# Patient Record
Sex: Female | Born: 2000 | Race: Black or African American | Hispanic: No | Marital: Single | Smoking: Never smoker
Health system: Southern US, Community
[De-identification: ages and names within clinical notes are randomized; demographics above are authoritative.]

## PROBLEM LIST (undated history)

## (undated) ENCOUNTER — Inpatient Hospital Stay (HOSPITAL_COMMUNITY): Payer: Self-pay

## (undated) DIAGNOSIS — M419 Scoliosis, unspecified: Secondary | ICD-10-CM

## (undated) DIAGNOSIS — Z8679 Personal history of other diseases of the circulatory system: Secondary | ICD-10-CM

## (undated) HISTORY — PX: BACK SURGERY: SHX140

## (undated) HISTORY — PX: DILATION AND CURETTAGE OF UTERUS: SHX78

---
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
## 2020-11-17 ENCOUNTER — Encounter (HOSPITAL_BASED_OUTPATIENT_CLINIC_OR_DEPARTMENT_OTHER): Payer: Self-pay | Admitting: Emergency Medicine

## 2020-11-17 ENCOUNTER — Other Ambulatory Visit: Payer: Self-pay

## 2020-11-17 ENCOUNTER — Emergency Department (HOSPITAL_BASED_OUTPATIENT_CLINIC_OR_DEPARTMENT_OTHER)
Admission: EM | Admit: 2020-11-17 | Discharge: 2020-11-17 | Disposition: A | Discharge status: Home / Self Care | Attending: Emergency Medicine | Admitting: Emergency Medicine

## 2020-11-17 DIAGNOSIS — R059 Cough, unspecified: Secondary | ICD-10-CM | POA: Diagnosis present

## 2020-11-17 DIAGNOSIS — U071 COVID-19: Secondary | ICD-10-CM | POA: Diagnosis not present

## 2020-11-17 DIAGNOSIS — Z20822 Contact with and (suspected) exposure to covid-19: Secondary | ICD-10-CM

## 2020-11-17 LAB — GROUP A STREP BY PCR: Group A Strep by PCR: NOT DETECTED

## 2020-11-17 MED ORDER — ACETAMINOPHEN 325 MG PO TABS
650.0000 mg | ORAL_TABLET | Freq: Once | ORAL | Status: AC | PRN
  Administered 2020-11-17: 650 mg via ORAL
  Filled 2020-11-17: qty 2

## 2020-11-17 MED ORDER — BENZONATATE 100 MG PO CAPS: 100.0000 mg | ORAL_CAPSULE | Freq: Three times a day (TID) | ORAL | 0 refills | Status: DC

## 2020-11-17 MED ORDER — IBUPROFEN 600 MG PO TABS: 600.0000 mg | ORAL_TABLET | Freq: Four times a day (QID) | ORAL | 0 refills | Status: DC | PRN

## 2020-11-17 MED ORDER — ONDANSETRON 4 MG PO TBDP: 4.0000 mg | ORAL_TABLET | Freq: Three times a day (TID) | ORAL | 0 refills | Status: AC | PRN

## 2020-11-17 NOTE — ED Provider Notes (Signed)
[facility] EMERGENCY DEPARTMENT Provider Note   CSN: [id number] Arrival date & time: 11/17/20  1627     History Chief Complaint  Patient presents with  . Sore Throat  . Cough    Debra Castro is a 20 y.o. female with no significant past medical history who presents to the ED due to sore throat, cough, and body aches x4 days.  Patient had a positive Covid exposure on Monday.  Patient is currently unvaccinated.  She admits to a productive cough with green phlegm and 2 episodes of nonbloody, nonbilious emesis.  Denies abdominal pain and diarrhea.  Denies chest pain or shortness of breath. No lower extremity edema. Denies difficulty swallowing.  No treatment prior to arrival.  No aggravating or alleviating factors.   History obtained from patient and past medical records. No interpreter used during encounter.      Past Medical History:  Diagnosis Date  . Scoliosis     There are no problems to display for this patient.   Past Surgical History:  Procedure Laterality Date  . BACK SURGERY       OB History   No obstetric history on file.     No family history on file.  Social History   Tobacco Use  . Smoking status: Never Smoker  . Smokeless tobacco: Never Used  Substance Use Topics  . Alcohol use: No  . Drug use: No    Home Medications Prior to Admission medications   Medication Sig Start Date End Date Taking? Authorizing Provider  benzonatate (TESSALON) 100 MG capsule Take 1 capsule (100 mg total) by mouth every 8 (eight) hours. 11/17/20  Yes Patrisia Faeth C, PA-C  ibuprofen (ADVIL) 600 MG tablet Take 1 tablet (600 mg total) by mouth every 6 (six) hours as needed. 11/17/20  Yes Annella Prowell C, PA-C  ondansetron (ZOFRAN ODT) 4 MG disintegrating tablet Take 1 tablet (4 mg total) by mouth every 8 (eight) hours as needed for nausea or vomiting. 11/17/20  Yes Romond Pipkins, Merla Riches, PA-C  methocarbamol (ROBAXIN) 500 MG tablet Take 1 tablet (500 mg total) by  mouth 2 (two) times daily. 10/12/18   Bethel Born, PA-C    Allergies    Patient has no known allergies.  Review of Systems   Review of Systems  Constitutional: Positive for fever. Negative for chills.  HENT: Positive for sore throat.   Respiratory: Positive for cough. Negative for shortness of breath.   Cardiovascular: Negative for chest pain and leg swelling.  Gastrointestinal: Positive for vomiting. Negative for abdominal pain and diarrhea.  Musculoskeletal: Positive for myalgias.  Neurological: Negative for headaches.    Physical Exam Updated Vital Signs BP 101/75 (BP Location: Right Arm)   Pulse (!) 105   Temp 98.3 F (36.8 C) (Oral)   Resp 16   Ht 5\' 10"  (1.778 m)   Wt 72.6 kg   SpO2 99%   BMI 22.96 kg/m   Physical Exam Vitals and nursing note reviewed.  Constitutional:      General: She is not in acute distress.    Appearance: She is not ill-appearing.  HENT:     Head: Normocephalic.     Mouth/Throat:     Comments: Posterior oropharynx clear and mucous membranes moist, there is mild erythema but no edema or tonsillar exudates, uvula midline, normal phonation, no trismus, tolerating secretions without difficulty. Eyes:     Pupils: Pupils are equal, round, and reactive to light.  Neck:     Comments:  No meningismus. Cardiovascular:     Rate and Rhythm: Normal rate and regular rhythm.     Pulses: Normal pulses.     Heart sounds: Normal heart sounds. No murmur heard. No friction rub. No gallop.   Pulmonary:     Effort: Pulmonary effort is normal.     Breath sounds: Normal breath sounds.     Comments: Respirations equal and unlabored, patient able to speak in full sentences, lungs clear to auscultation bilaterally Abdominal:     General: Abdomen is flat. Bowel sounds are normal. There is no distension.     Palpations: Abdomen is soft.     Tenderness: There is no abdominal tenderness. There is no guarding or rebound.  Musculoskeletal:     Cervical back:  Neck supple.     Comments: No lower extremity edema. Negative homan sign bilaterally.  Skin:    General: Skin is warm and dry.  Neurological:     General: No focal deficit present.     Mental Status: She is alert.  Psychiatric:        Mood and Affect: Mood normal.        Behavior: Behavior normal.     ED Results / Procedures / Treatments   Labs (all labs ordered are listed, but only abnormal results are displayed) Labs Reviewed  GROUP A STREP BY PCR  SARS CORONAVIRUS 2 (TAT 6-24 HRS)    EKG None  Radiology No results found.  Procedures Procedures (including critical care time)  Medications Ordered in ED Medications  acetaminophen (TYLENOL) tablet 650 mg (650 mg Oral Given 11/17/20 1646)    ED Course  I have reviewed the triage vital signs and the nursing notes.  Pertinent labs & imaging results that were available during my care of the patient were reviewed by me and considered in my medical decision making (see chart for details).    MDM Rules/Calculators/A&P                         20 year old female presents to the ED due to Covid-like symptoms x4 days.  Patient denies chest pain and shortness of breath.  Patient is currently unvaccinated and had a positive for Covid exposure.  Upon arrival, is patient febrile and tachycardic likely due to temperature.  Unfortunately, patient waited over 6 hours prior to initial evaluation.  Temperature and heart rate improved. Patient mildly tachycardic at 105.  Patient in no acute distress and non-ill-appearing.  Physical exam reassuring.  Lungs clear to auscultation bilaterally with no rales, rhonchi, or wheeze.  Doubt pneumonia.  No meningismus to suggest meningitis.  Throat with mild erythema with no tonsillar hypertrophy.  No abscess appreciated.  Strep ordered at triage which is negative.  Covid test pending.  Suspect symptoms related to Covid infection vs. Other viral etiology.  Low suspicion for DVT/PE given no chest pain or  shortness of breath.  No clinical signs of DVT on exam.  Will discharge patient with symptomatic treatment.  Quarantine guidelines discussed with patient. Strict ED precautions discussed with patient. Patient states understanding and agrees to plan. Patient discharged home in no acute distress and stable vitals.  Discussed case with Dr. Read Drivers who agrees with assessment and plan.  Debra Castro was evaluated in Emergency Department on 11/17/2020 for the symptoms described in the history of present illness. She was evaluated in the context of the global COVID-19 pandemic, which necessitated consideration that the patient might be at risk for infection  with the SARS-CoV-2 virus that causes COVID-19. Institutional protocols and algorithms that pertain to the evaluation of patients at risk for COVID-19 are in a state of rapid change based on information released by regulatory bodies including the CDC and federal and state organizations. These policies and algorithms were followed during the patient's care in the ED.  Final Clinical Impression(s) / ED Diagnoses Final diagnoses:  Cough  Suspected COVID-19 virus infection    Rx / DC Orders ED Discharge Orders         Ordered    benzonatate (TESSALON) 100 MG capsule  Every 8 hours        11/17/20 2322    ondansetron (ZOFRAN ODT) 4 MG disintegrating tablet  Every 8 hours PRN        11/17/20 2322    ibuprofen (ADVIL) 600 MG tablet  Every 6 hours PRN        11/17/20 2322           Karie Kirks 11/17/20 2324    Davonna Belling, MD 11/17/20 [phone]

## 2020-11-17 NOTE — ED Triage Notes (Signed)
Sore throat, cough, body aches x 2 days

## 2020-11-17 NOTE — Discharge Instructions (Addendum)
As discussed, your Covid test is pending.  I suspect your symptoms are related to Covid infection or another viral infection.  I am sending you with cough medication, nausea medication, and ibuprofen.  Take as needed.  Continue to self quarantine until your results become available.  Please follow-up with PCP if symptoms do not improve over the next week.  Return to the ER for new or worsening symptoms.

## 2020-11-19 LAB — SARS CORONAVIRUS 2 (TAT 6-24 HRS): SARS Coronavirus 2: POSITIVE — AB

## 2021-01-19 ENCOUNTER — Other Ambulatory Visit: Payer: Self-pay

## 2021-01-19 ENCOUNTER — Encounter (HOSPITAL_BASED_OUTPATIENT_CLINIC_OR_DEPARTMENT_OTHER): Payer: Self-pay | Admitting: *Deleted

## 2021-01-19 ENCOUNTER — Emergency Department (HOSPITAL_BASED_OUTPATIENT_CLINIC_OR_DEPARTMENT_OTHER): Payer: 59

## 2021-01-19 ENCOUNTER — Emergency Department (HOSPITAL_BASED_OUTPATIENT_CLINIC_OR_DEPARTMENT_OTHER)
Admission: EM | Admit: 2021-01-19 | Discharge: 2021-01-19 | Disposition: A | Discharge status: Home / Self Care | Payer: 59 | Attending: Emergency Medicine | Admitting: Emergency Medicine

## 2021-01-19 DIAGNOSIS — O26851 Spotting complicating pregnancy, first trimester: Secondary | ICD-10-CM | POA: Insufficient documentation

## 2021-01-19 DIAGNOSIS — Z3A12 12 weeks gestation of pregnancy: Secondary | ICD-10-CM | POA: Diagnosis not present

## 2021-01-19 DIAGNOSIS — N939 Abnormal uterine and vaginal bleeding, unspecified: Secondary | ICD-10-CM

## 2021-01-19 DIAGNOSIS — O4690 Antepartum hemorrhage, unspecified, unspecified trimester: Secondary | ICD-10-CM

## 2021-01-19 DIAGNOSIS — O418X10 Other specified disorders of amniotic fluid and membranes, first trimester, not applicable or unspecified: Secondary | ICD-10-CM

## 2021-01-19 LAB — COMPREHENSIVE METABOLIC PANEL
ALT: 13 U/L (ref 0–44)
AST: 19 U/L (ref 15–41)
Albumin: 4.1 g/dL (ref 3.5–5.0)
Alkaline Phosphatase: 35 U/L — ABNORMAL LOW (ref 38–126)
Anion gap: 11 (ref 5–15)
BUN: 9 mg/dL (ref 6–20)
CO2: 21 mmol/L — ABNORMAL LOW (ref 22–32)
Calcium: 9.5 mg/dL (ref 8.9–10.3)
Chloride: 102 mmol/L (ref 98–111)
Creatinine, Ser: 0.62 mg/dL (ref 0.44–1.00)
GFR, Estimated: >60 mL/min (ref >60)
Glucose, Bld: 84 mg/dL (ref 70–99)
Potassium: 3.1 mmol/L — ABNORMAL LOW (ref 3.5–5.1)
Sodium: 134 mmol/L — ABNORMAL LOW (ref 135–145)
Total Bilirubin: 0.1 mg/dL — ABNORMAL LOW (ref 0.3–1.2)
Total Protein: 7.9 g/dL (ref 6.5–8.1)

## 2021-01-19 LAB — CBC WITH DIFFERENTIAL/PLATELET
Abs Immature Granulocytes: 0.02 10*3/uL (ref 0.00–0.07)
Basophils Absolute: 0.1 10*3/uL (ref 0.0–0.1)
Basophils Relative: 1 %
Eosinophils Absolute: 0.1 10*3/uL (ref 0.0–0.5)
Eosinophils Relative: 1 %
HCT: 34.3 % — ABNORMAL LOW (ref 36.0–46.0)
Hemoglobin: 11 g/dL — ABNORMAL LOW (ref 12.0–15.0)
Immature Granulocytes: 0 %
Lymphocytes Relative: 27 %
Lymphs Abs: 2.2 10*3/uL (ref 0.7–4.0)
MCH: 24.9 pg — ABNORMAL LOW (ref 26.0–34.0)
MCHC: 32.1 g/dL (ref 30.0–36.0)
MCV: 77.6 fL — ABNORMAL LOW (ref 80.0–100.0)
Monocytes Absolute: 0.6 10*3/uL (ref 0.1–1.0)
Monocytes Relative: 7 %
Neutro Abs: 5.1 10*3/uL (ref 1.7–7.7)
Neutrophils Relative %: 64 %
Platelets: 249 10*3/uL (ref 150–400)
RBC: 4.42 MIL/uL (ref 3.87–5.11)
RDW: 13.6 % (ref 11.5–15.5)
WBC: 8 10*3/uL (ref 4.0–10.5)
nRBC: 0 % (ref 0.0–0.2)

## 2021-01-19 LAB — ABO/RH: ABO/RH(D): O POS

## 2021-01-19 NOTE — ED Provider Notes (Addendum)
[facility] EMERGENCY DEPARTMENT Provider Note   CSN: [id number] Arrival date & time: 01/19/21  1614     History Chief Complaint  Patient presents with  . Vaginal Bleeding    12 wk preg    Debra Castro is a 20 y.o. female.  20 year old female G2, P1 with routine prenatal care through [facility] presents with complaint of vaginal bleeding at 12 weeks and 1 day pregnant.  Patient states she was told at 6 weeks she had a subchorionic hemorrhage but has not had any bleeding since that time.  Patient states today at 230 she noticed some light pink bleeding followed by heavier bright red bleeding, did bleed through her pants.  Patient has soaked through one pad while waiting in the ER lobby over the last 2 hours.  Also reports lower abdominal cramping.  Patient called her OB and was advised to come to the ER.        Past Medical History:  Diagnosis Date  . Scoliosis     There are no problems to display for this patient.   Past Surgical History:  Procedure Laterality Date  . BACK SURGERY       OB History    Gravida  1   Para      Term      Preterm      AB      Living        SAB      IAB      Ectopic      Multiple      Live Births              No family history on file.  Social History   Tobacco Use  . Smoking status: Never Smoker  . Smokeless tobacco: Never Used  Substance Use Topics  . Alcohol use: No  . Drug use: No    Home Medications Prior to Admission medications   Medication Sig Start Date End Date Taking? Authorizing Provider  benzonatate (TESSALON) 100 MG capsule Take 1 capsule (100 mg total) by mouth every 8 (eight) hours. 11/17/20   Mannie Stabile, PA-C  ibuprofen (ADVIL) 600 MG tablet Take 1 tablet (600 mg total) by mouth every 6 (six) hours as needed. 11/17/20   Mannie Stabile, PA-C  methocarbamol (ROBAXIN) 500 MG tablet Take 1 tablet (500 mg total) by mouth 2 (two) times daily. 10/12/18    Bethel Born, PA-C  ondansetron (ZOFRAN ODT) 4 MG disintegrating tablet Take 1 tablet (4 mg total) by mouth every 8 (eight) hours as needed for nausea or vomiting. 11/17/20   Mannie Stabile, PA-C    Allergies    Patient has no known allergies.  Review of Systems   Review of Systems  Constitutional: Negative for chills and fever.  Respiratory: Negative for shortness of breath.   Cardiovascular: Negative for chest pain.  Gastrointestinal: Negative for constipation, diarrhea, nausea and vomiting.  Genitourinary: Positive for pelvic pain and vaginal bleeding.  Musculoskeletal: Negative for back pain.  Skin: Negative for rash and wound.  Allergic/Immunologic: Negative for immunocompromised state.  Neurological: Negative for dizziness, weakness and light-headedness.  Hematological: Does not bruise/bleed easily.  All other systems reviewed and are negative.   Physical Exam Updated Vital Signs BP 114/65 (BP Location: Right Arm)   Pulse 83   Temp 98.3 F (36.8 C) (Oral)   Resp 20   Ht 5\' 10"  (1.778 m)   Wt 65.8 kg  LMP 10/26/2020   SpO2 100%   BMI 20.81 kg/m   Physical Exam Vitals and nursing note reviewed. Exam conducted with a chaperone present.  Constitutional:      General: She is not in acute distress.    Appearance: She is well-developed and well-nourished. She is not diaphoretic.  HENT:     Head: Normocephalic and atraumatic.  Cardiovascular:     Rate and Rhythm: Normal rate and regular rhythm.     Pulses: Normal pulses.     Heart sounds: Normal heart sounds.  Pulmonary:     Effort: Pulmonary effort is normal.     Breath sounds: Normal breath sounds.  Abdominal:     Palpations: Abdomen is soft.     Tenderness: There is no abdominal tenderness. There is no right CVA tenderness or left CVA tenderness.  Skin:    General: Skin is warm and dry.     Findings: No erythema or rash.  Neurological:     Mental Status: She is alert and oriented to person, place,  and time.  Psychiatric:        Mood and Affect: Mood and affect normal.        Behavior: Behavior normal.     ED Results / Procedures / Treatments   Labs (all labs ordered are listed, but only abnormal results are displayed) Labs Reviewed  CBC WITH DIFFERENTIAL/PLATELET - Abnormal; Notable for the following components:      Result Value   Hemoglobin 11.0 (*)    HCT 34.3 (*)    MCV 77.6 (*)    MCH 24.9 (*)    All other components within normal limits  COMPREHENSIVE METABOLIC PANEL - Abnormal; Notable for the following components:   Sodium 134 (*)    Potassium 3.1 (*)    CO2 21 (*)    Alkaline Phosphatase 35 (*)    Total Bilirubin 0.1 (*)    All other components within normal limits  ABO/RH    EKG None  Radiology US OB LESS THAN 14 WEEKS WITH OB TRANSVAGINAL  Result Date: 01/19/2021 CLINICAL DATA:  Pregnant patient in first-trimester pregnancy with vaginal bleeding and cramping today. Beta hCG not available. Gestational age [redacted] weeks 1 day based on prior imaging. Subarachnoid hemorrhage on outside ultrasound yesterday. EXAM: OBSTETRIC <14 WK [place] AND TRANSVAGINAL OB US TECHNIQUE: Both transabdominal and transvaginal ultrasound examinations were performed for complete evaluation of the gestation as well as the maternal uterus, adnexal regions, and pelvic cul-de-sac. Transvaginal technique was performed to assess early pregnancy. COMPARISON:  None. FINDINGS: Intrauterine gestational sac: Single Yolk sac:  Not Visualized, likely normal for gestational age. Embryo:  Visualized. Cardiac Activity: Visualized. Heart Rate: 168 bpm CRL:  62 mm   12 w   4 d                  [place] EDC: 07/30/2021 Subchorionic hemorrhage: Moderate, primarily anterior to the gestational sac. Maternal uterus/adnexae: Trace fluid in the cervical canal. Neither ovary is visualized. Trace pelvic free fluid. No adnexal mass. IMPRESSION: 1. Single live intrauterine gestation estimated gestational age [redacted] weeks 4 days based on  crown-rump length for ultrasound [facility] 07/30/2021. Trace fluid in the cervical canal is nonspecific. 2. Moderate subchorionic hemorrhage. Electronically Signed   By: Narda Rutherford M.D.   On: 01/19/2021 19:43    Procedures Procedures   Medications Ordered in ED Medications - No data to display  ED Course  I have reviewed the triage vital signs and the  nursing notes.  Pertinent labs & imaging results that were available during my care of the patient were reviewed by me and considered in my medical decision making (see chart for details).  Clinical Course as of 01/19/21 2114  Sat Jan 19, 2021  5346 20 year old female, [redacted]w[redacted]d pregnant with previous diagnosis of subchorionic hemorrhage presents with complaint of abdominal cramping and vaginal bleeding today. No tenderness on exam.  Ultrasound shows 12-week 4-day gestation with positive cardiac activity, subchorionic hemorrhage. CBC with hemoglobin 11.4, hematocrit 34.1, CMP with mild hypokalemia otherwise no significant changes.  Rh a positive.  Patient was tachycardic on arrival, vitals have been stable since. Discussed results and plan of care with patient, recommend home to rest, follow-up with her OB on Monday.  Discussed seeking care in nearest emergency room if needed, discussed/recommended emergent OB care at patient's hospital of choice for her OB practice which is [facility]. [LM]    Clinical Course User Index [LM] Alden Hipp   MDM Rules/Calculators/A&P                          Final Clinical Impression(s) / ED Diagnoses Final diagnoses:  Vaginal bleeding in pregnancy  Subchorionic hematoma in first trimester, single or unspecified fetus    Rx / DC Orders ED Discharge Orders    None       Jeannie Fend, PA-C 01/19/21 2113    Jeannie Fend, PA-C 01/19/21 2114    Milagros Loll, MD 01/21/21 [phone]

## 2021-01-19 NOTE — Discharge Instructions (Addendum)
Follow up with your OB. Go to [facility] (formally [facility]) in [facility] if possible for care with your OB team if needed. [address], [place]

## 2021-01-19 NOTE — ED Notes (Signed)
Patient transported to Ultrasound 

## 2021-01-19 NOTE — ED Triage Notes (Addendum)
Pt reports she is [redacted] weeks pregnant and today noticed some bleeding. She states has gone through "3 pairs of underwear, a pair of sweatpants and 2 liners" since 2 pm and now is wearing a pad. Reports cramps. States she has been on pelvic rest due to a subchorionic hemorrhage. She had an ultrasound yesterday

## 2021-11-28 ENCOUNTER — Ambulatory Visit: Payer: PRIVATE HEALTH INSURANCE | Attending: Internal Medicine

## 2021-11-28 ENCOUNTER — Ambulatory Visit: Payer: PRIVATE HEALTH INSURANCE

## 2021-11-28 DIAGNOSIS — Z23 Encounter for immunization: Secondary | ICD-10-CM

## 2021-11-28 NOTE — Progress Notes (Signed)
° °  Covid-19 Vaccination Clinic  Name:  Debra Castro    MRN: [id number] DOB: 2001/01/13  11/28/2021  [name] was observed post Covid-19 immunization for 15 minutes without incident. She was provided with Vaccine Information Sheet and instruction to access the V-Safe system.   [name] was instructed to call 911 with any severe reactions post vaccine: Difficulty breathing  Swelling of face and throat  A fast heartbeat  A bad rash all over body  Dizziness and weakness   Immunizations Administered     Name Date Dose VIS Date Route   JANSSEN COVID-19 VACCINE 11/28/2021 12:05 PM 0.5 mL 09/05/2020 Intramuscular   Manufacturer: [employer]   Lot: [id number]   NDC: 03546-568-12

## 2021-11-29 ENCOUNTER — Other Ambulatory Visit (HOSPITAL_BASED_OUTPATIENT_CLINIC_OR_DEPARTMENT_OTHER): Payer: Self-pay

## 2021-11-29 MED ORDER — COVID-19 AD26 VACCINE(JANSSEN) 0.5 ML IM SUSP
INTRAMUSCULAR | 0 refills | Status: DC
  Filled 2021-11-29: qty 0.5, 1d supply, fill #0

## 2022-07-31 IMAGING — US US OB < 14 WEEKS - US OB TV
1 series · 13 of 28 positions shown · non-contrast
Comparison: None.

CLINICAL DATA: Pregnant patient in first-trimester pregnancy with
vaginal bleeding and cramping today. Beta hCG not available.
Gestational age 12 weeks 1 day based on prior imaging. Subarachnoid
hemorrhage on outside ultrasound yesterday.

EXAM:
OBSTETRIC <14 WK US AND TRANSVAGINAL OB US
TECHNIQUE: Both transabdominal and transvaginal ultrasound examinations were
performed for complete evaluation of the gestation as well as the
maternal uterus, adnexal regions, and pelvic cul-de-sac.
Transvaginal technique was performed to assess early pregnancy.

[Series 1: us ob < 14 weeks - us ob tv · 13 of 69 slices shown]
[im 3/69]
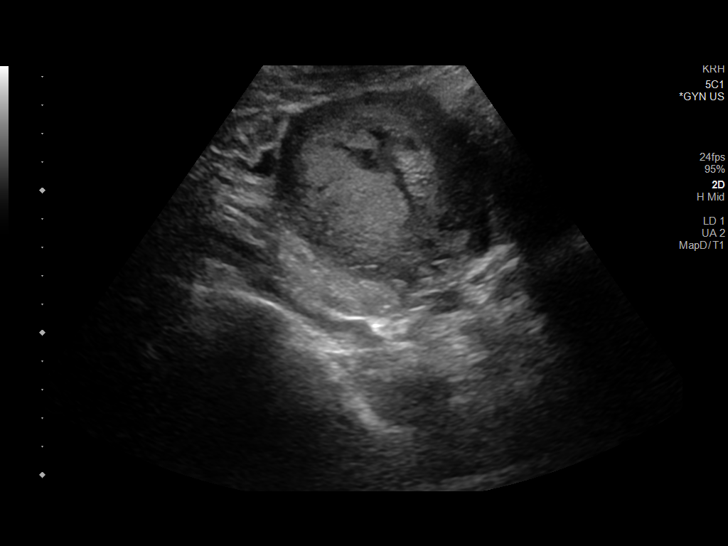
[im 8/69]
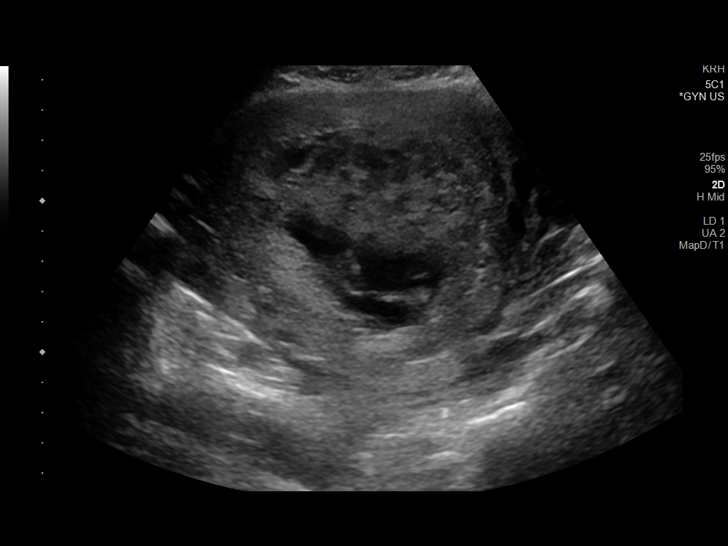
[im 13/69]
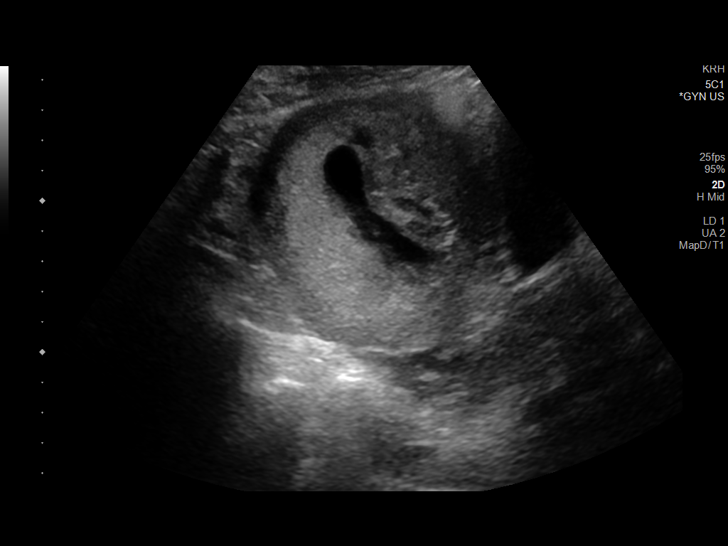
[im 18/69]
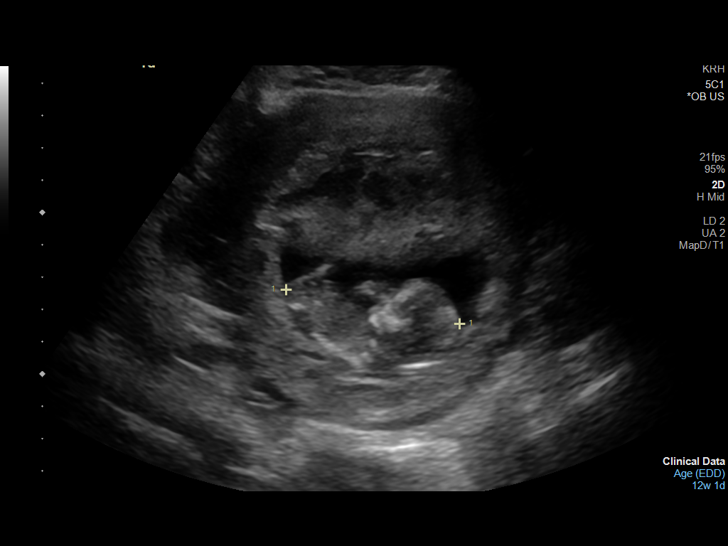
[im 23/69]
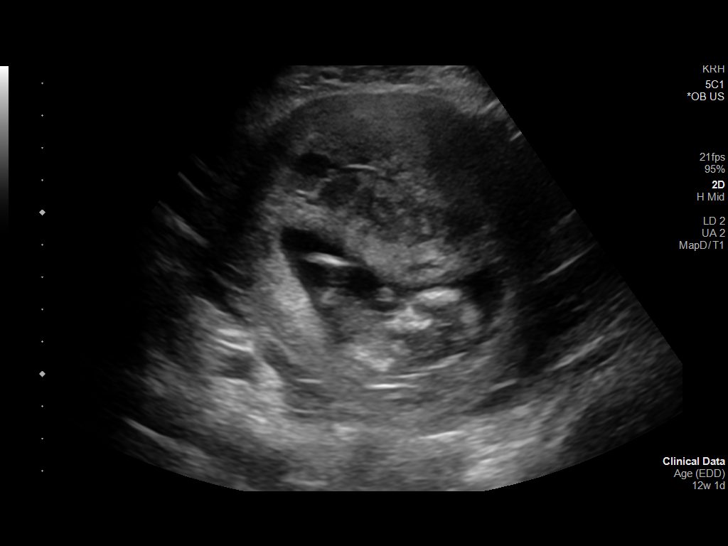
[im 28/69]
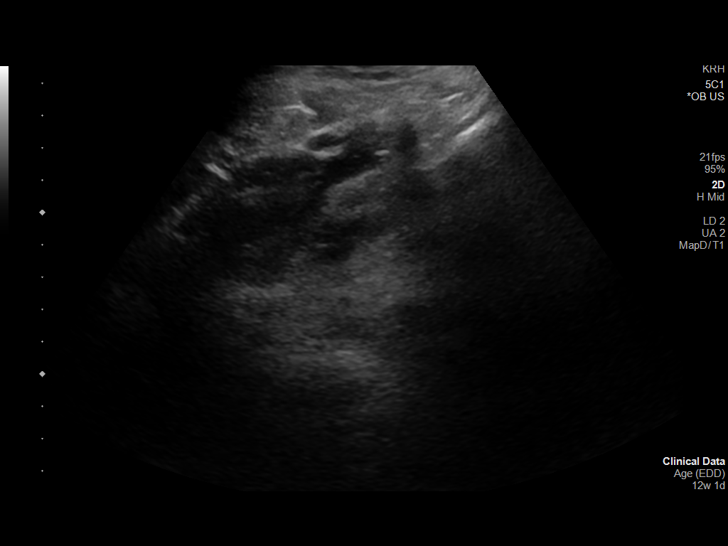
[im 36/69]
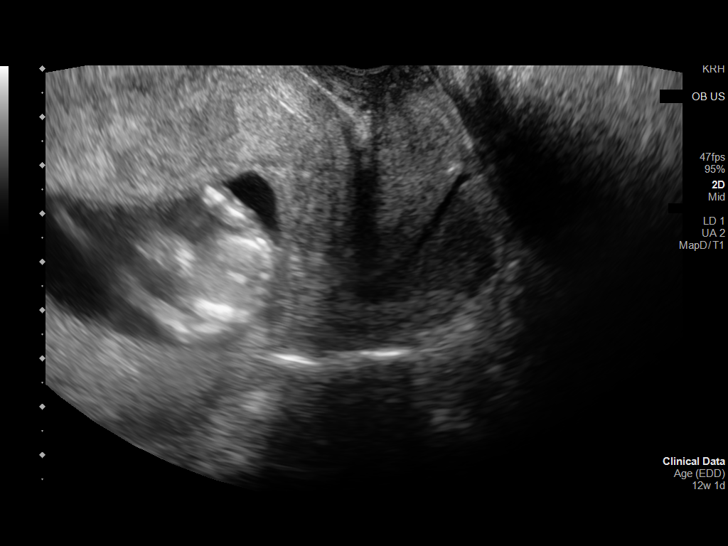
[im 41/69]
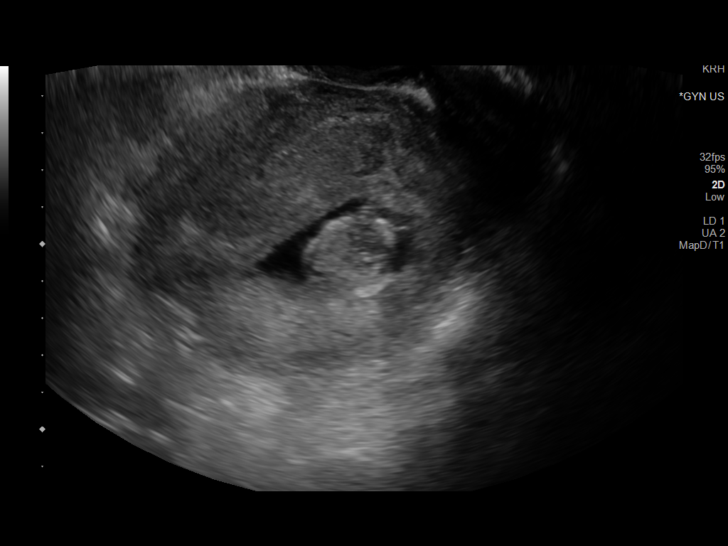
[im 46/69]
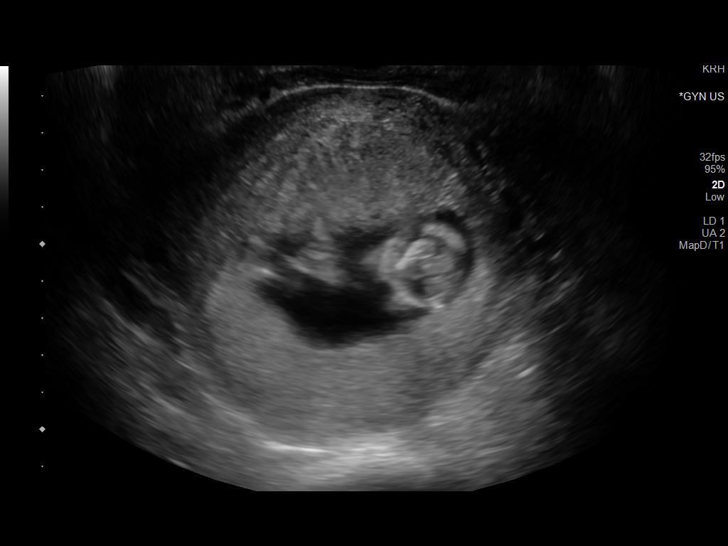
[im 51/69]
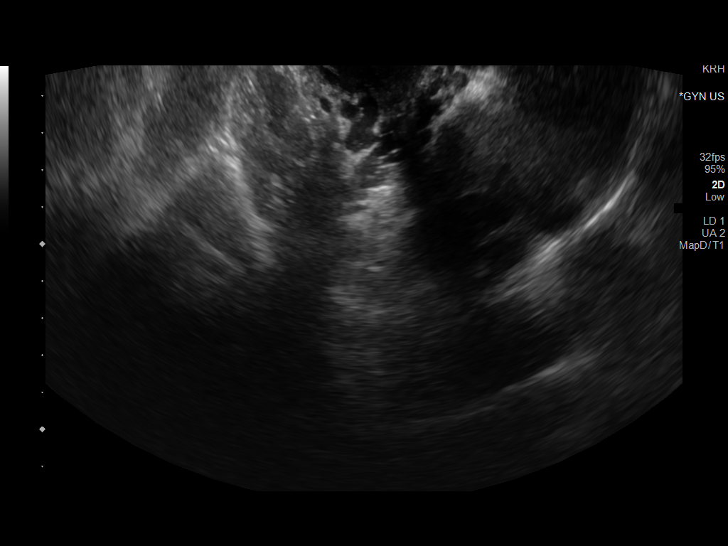
[im 56/69]
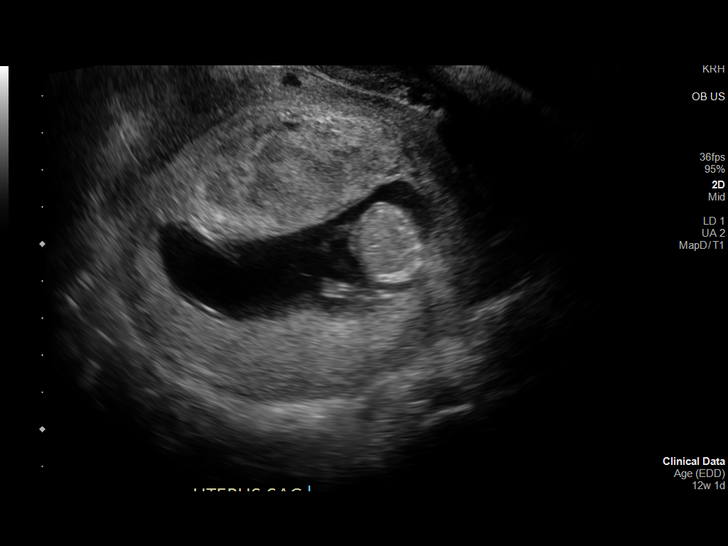
[im 61/69]
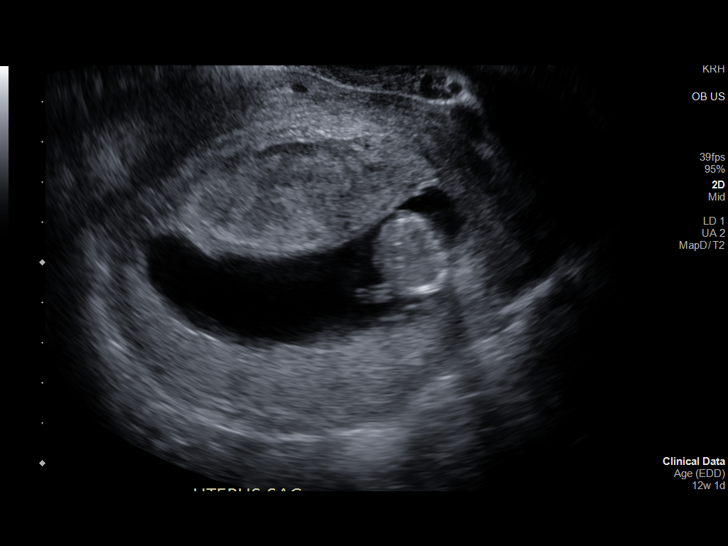
[im 66/69]
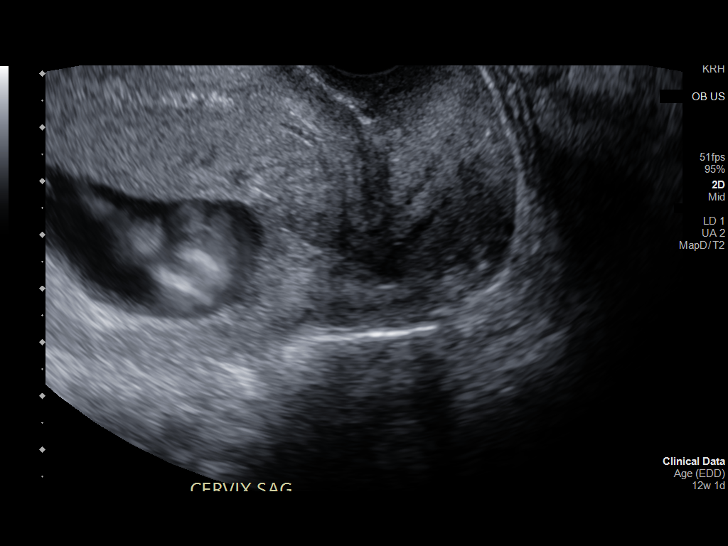

[13 of 28 positions shown; findings below may reference images not displayed]

FINDINGS: Intrauterine gestational sac: Single

Yolk sac:  Not Visualized, likely normal for gestational age.

Embryo:  Visualized.

Cardiac Activity: Visualized.

Heart Rate: 168 bpm

CRL:  62 mm   12 w   4 d                  US EDC: 07/30/2021

Subchorionic hemorrhage: Moderate, primarily anterior to the
gestational sac.

Maternal uterus/adnexae: Trace fluid in the cervical canal. Neither
ovary is visualized. Trace pelvic free fluid. No adnexal mass.
IMPRESSION: 1. Single live intrauterine gestation estimated gestational age 12
weeks 4 days based on crown-rump length for ultrasound EDC
07/30/2021. Trace fluid in the cervical canal is nonspecific.
2. Moderate subchorionic hemorrhage.

## 2022-10-18 ENCOUNTER — Emergency Department (HOSPITAL_BASED_OUTPATIENT_CLINIC_OR_DEPARTMENT_OTHER): Payer: PRIVATE HEALTH INSURANCE

## 2022-10-18 ENCOUNTER — Emergency Department (HOSPITAL_BASED_OUTPATIENT_CLINIC_OR_DEPARTMENT_OTHER)
Admission: EM | Admit: 2022-10-18 | Discharge: 2022-10-19 | Disposition: A | Discharge status: Home / Self Care | Payer: PRIVATE HEALTH INSURANCE | Attending: Emergency Medicine | Admitting: Emergency Medicine

## 2022-10-18 ENCOUNTER — Encounter (HOSPITAL_BASED_OUTPATIENT_CLINIC_OR_DEPARTMENT_OTHER): Payer: Self-pay

## 2022-10-18 ENCOUNTER — Other Ambulatory Visit: Payer: Self-pay

## 2022-10-18 DIAGNOSIS — R0789 Other chest pain: Secondary | ICD-10-CM | POA: Diagnosis not present

## 2022-10-18 DIAGNOSIS — R002 Palpitations: Secondary | ICD-10-CM | POA: Diagnosis present

## 2022-10-18 HISTORY — DX: Personal history of other diseases of the circulatory system: Z86.79

## 2022-10-18 LAB — CBC
HCT: 39.8 % (ref 36.0–46.0)
Hemoglobin: 12.3 g/dL (ref 12.0–15.0)
MCH: 24.2 pg — ABNORMAL LOW (ref 26.0–34.0)
MCHC: 30.9 g/dL (ref 30.0–36.0)
MCV: 78.3 fL — ABNORMAL LOW (ref 80.0–100.0)
Platelets: 237 10*3/uL (ref 150–400)
RBC: 5.08 MIL/uL (ref 3.87–5.11)
RDW: 14.2 % (ref 11.5–15.5)
WBC: 6.9 10*3/uL (ref 4.0–10.5)
nRBC: 0 % (ref 0.0–0.2)

## 2022-10-18 LAB — BASIC METABOLIC PANEL
Anion gap: 9 (ref 5–15)
BUN: 13 mg/dL (ref 6–20)
CO2: 22 mmol/L (ref 22–32)
Calcium: 9.4 mg/dL (ref 8.9–10.3)
Chloride: 106 mmol/L (ref 98–111)
Creatinine, Ser: 0.73 mg/dL (ref 0.44–1.00)
GFR, Estimated: >60 mL/min (ref >60)
Glucose, Bld: 99 mg/dL (ref 70–99)
Potassium: 3.5 mmol/L (ref 3.5–5.1)
Sodium: 137 mmol/L (ref 135–145)

## 2022-10-18 LAB — TROPONIN I (HIGH SENSITIVITY): Troponin I (High Sensitivity): <2 ng/L (ref <18)

## 2022-10-18 NOTE — ED Provider Notes (Signed)
[facility] EMERGENCY DEPARTMENT  Provider Note  CSN: [id number] Arrival date & time: 10/18/22 2011  History Chief Complaint  Patient presents with  . Chest Pain  . Palpitations    Debra Castro is a 21 y.o. female reports several days of palpitations, apple watch has alerted for several episodes of HR in the 150-160s but not sustained. She has felt her heart skipping beats and feeling irregular. She had a ZIO patch placed yesterday by her PCP but no labs had been done. She reports today at work she began to feel lightheaded with headache, L chest and back pains and abdominal pain with nausea. All of those symptoms have since subsided. She recently travelled to [place] by plane. Denies leg swelling.    Home Medications Prior to Admission medications   Medication Sig Start Date End Date Taking? Authorizing Provider  benzonatate (TESSALON) 100 MG capsule Take 1 capsule (100 mg total) by mouth every 8 (eight) hours. 11/17/20   Mannie Stabile, PA-C  COVID-19 Ad26 vaccine, JANSSEN/J&J, 0.5 ML injection Inject into the muscle. 11/28/21   Judyann Munson, MD  ibuprofen (ADVIL) 600 MG tablet Take 1 tablet (600 mg total) by mouth every 6 (six) hours as needed. 11/17/20   Mannie Stabile, PA-C  methocarbamol (ROBAXIN) 500 MG tablet Take 1 tablet (500 mg total) by mouth 2 (two) times daily. 10/12/18   Bethel Born, PA-C  ondansetron (ZOFRAN ODT) 4 MG disintegrating tablet Take 1 tablet (4 mg total) by mouth every 8 (eight) hours as needed for nausea or vomiting. 11/17/20   Mannie Stabile, PA-C     Allergies    Patient has no known allergies.   Review of Systems   Review of Systems Please see HPI for pertinent positives and negatives  Physical Exam BP (!) 151/112   Pulse (!) 106   Temp 99.2 F (37.3 C) (Oral)   Resp 16   Ht 5\' 11"  (1.803 m)   Wt 72.6 kg   LMP 09/29/2022   SpO2 100%   BMI 22.32 kg/m   Physical Exam Vitals and nursing note reviewed.   Constitutional:      Appearance: Normal appearance.  HENT:     Head: Normocephalic and atraumatic.     Nose: Nose normal.     Mouth/Throat:     Mouth: Mucous membranes are moist.  Eyes:     Extraocular Movements: Extraocular movements intact.     Conjunctiva/sclera: Conjunctivae normal.  Cardiovascular:     Rate and Rhythm: Normal rate.  Pulmonary:     Effort: Pulmonary effort is normal.     Breath sounds: Normal breath sounds.  Abdominal:     General: Abdomen is flat.     Palpations: Abdomen is soft.     Tenderness: There is no abdominal tenderness.  Musculoskeletal:        General: No swelling. Normal range of motion.     Cervical back: Neck supple.  Skin:    General: Skin is warm and dry.  Neurological:     General: No focal deficit present.     Mental Status: She is alert.  Psychiatric:        Mood and Affect: Mood normal.     ED Results / Procedures / Treatments   EKG EKG Interpretation  Date/Time:  Saturday October 18 2022 20:22:21 EST Ventricular Rate:  96 PR Interval:  136 QRS Duration: 90 QT Interval:  350 QTC Calculation: 442 R Axis:   78 Text  Interpretation: Normal sinus rhythm Cannot rule out Inferior infarct , age undetermined Nonspecific T wave abnormality Abnormal ECG When compared with ECG of 22-Jan-2018 20:22, No significant change since last tracing Confirmed by Susy Frizzle [phone]) on 10/18/2022 11:35:01 PM  Procedures Procedures  Medications Ordered in the ED Medications - No data to display  Initial Impression and Plan  Patient with rapid heart rate, palpitations and vague self-limited chest/back pains with several other nonspecific symptoms earlier today that have since resolved. She is feeling well at the time of my evaluation. She is currently wearing a ZIO patch. Will check labs including Trops, dimer, TSH, UDS. EKG without acute changes or dysrhythmias.   ED Course   Clinical Course as of 10/18/22 2348  Sat Oct 18, 2022  2348  CBC is normal., BMP and Trop neg. I personally viewed the images from radiology studies and agree with radiologist interpretation: CXR is clear.  [CS]    Clinical Course User Index [CS] Pollyann Savoy, MD     MDM Rules/Calculators/A&P Medical Decision Making Amount and/or Complexity of Data Reviewed Labs: ordered. Radiology: ordered.    Final Clinical Impression(s) / ED Diagnoses Final diagnoses:  None    Rx / DC Orders ED Discharge Orders     None

## 2022-10-18 NOTE — ED Notes (Signed)
Pt not able to give urine at this time. 

## 2022-10-18 NOTE — ED Triage Notes (Addendum)
Pt reports irregular HR with CP and back pain that began at 3pm. Pt reports nausea. Did not take pain meds. Pt denies fever and being around anybody who is sick. Pt reports hx of irregular HR; wearing a holter monitor at this time. Endorses SOB

## 2022-10-19 ENCOUNTER — Encounter (HOSPITAL_BASED_OUTPATIENT_CLINIC_OR_DEPARTMENT_OTHER): Payer: Self-pay

## 2022-10-19 ENCOUNTER — Emergency Department (HOSPITAL_BASED_OUTPATIENT_CLINIC_OR_DEPARTMENT_OTHER): Payer: PRIVATE HEALTH INSURANCE

## 2022-10-19 DIAGNOSIS — R002 Palpitations: Secondary | ICD-10-CM | POA: Diagnosis not present

## 2022-10-19 LAB — URINALYSIS, ROUTINE W REFLEX MICROSCOPIC
Bilirubin Urine: NEGATIVE
Glucose, UA: NEGATIVE mg/dL
Hgb urine dipstick: NEGATIVE
Ketones, ur: NEGATIVE mg/dL
Leukocytes,Ua: NEGATIVE
Nitrite: NEGATIVE
Protein, ur: NEGATIVE mg/dL
Specific Gravity, Urine: 1.02 (ref 1.005–1.030)
pH: 7.5 (ref 5.0–8.0)

## 2022-10-19 LAB — RAPID URINE DRUG SCREEN, HOSP PERFORMED
Amphetamines: NOT DETECTED
Barbiturates: NOT DETECTED
Benzodiazepines: NOT DETECTED
Cocaine: NOT DETECTED
Opiates: NOT DETECTED
Tetrahydrocannabinol: NOT DETECTED

## 2022-10-19 LAB — TROPONIN I (HIGH SENSITIVITY): Troponin I (High Sensitivity): <2 ng/L (ref <18)

## 2022-10-19 LAB — TSH: TSH: 0.67 u[IU]/mL (ref 0.350–4.500)

## 2022-10-19 LAB — D-DIMER, QUANTITATIVE: D-Dimer, Quant: 0.57 ug/mL-FEU — ABNORMAL HIGH (ref 0.00–0.50)

## 2022-10-19 LAB — PREGNANCY, URINE: Preg Test, Ur: NEGATIVE

## 2022-10-19 MED ORDER — IOHEXOL 350 MG/ML SOLN
75.0000 mL | Freq: Once | INTRAVENOUS | Status: AC | PRN
  Administered 2022-10-19: 75 mL via INTRAVENOUS

## 2022-10-19 NOTE — ED Notes (Signed)
Patient transported to CT 

## 2023-12-16 ENCOUNTER — Inpatient Hospital Stay (HOSPITAL_COMMUNITY): Payer: PRIVATE HEALTH INSURANCE

## 2023-12-16 ENCOUNTER — Observation Stay (HOSPITAL_COMMUNITY)
Admission: EM | Admit: 2023-12-16 | Discharge: 2023-12-17 | Disposition: A | Discharge status: Home / Self Care | Payer: PRIVATE HEALTH INSURANCE | Attending: Obstetrics and Gynecology | Admitting: Obstetrics and Gynecology

## 2023-12-16 ENCOUNTER — Encounter (HOSPITAL_COMMUNITY): Payer: Self-pay

## 2023-12-16 ENCOUNTER — Emergency Department (HOSPITAL_COMMUNITY): Payer: PRIVATE HEALTH INSURANCE

## 2023-12-16 ENCOUNTER — Other Ambulatory Visit: Payer: Self-pay

## 2023-12-16 DIAGNOSIS — O9A213 Injury, poisoning and certain other consequences of external causes complicating pregnancy, third trimester: Secondary | ICD-10-CM

## 2023-12-16 DIAGNOSIS — S8001XA Contusion of right knee, initial encounter: Secondary | ICD-10-CM | POA: Insufficient documentation

## 2023-12-16 DIAGNOSIS — S60221A Contusion of right hand, initial encounter: Secondary | ICD-10-CM | POA: Diagnosis not present

## 2023-12-16 DIAGNOSIS — Z79899 Other long term (current) drug therapy: Secondary | ICD-10-CM | POA: Insufficient documentation

## 2023-12-16 DIAGNOSIS — Z3A30 30 weeks gestation of pregnancy: Secondary | ICD-10-CM

## 2023-12-16 DIAGNOSIS — O4703 False labor before 37 completed weeks of gestation, third trimester: Secondary | ICD-10-CM | POA: Diagnosis not present

## 2023-12-16 DIAGNOSIS — Z3493 Encounter for supervision of normal pregnancy, unspecified, third trimester: Secondary | ICD-10-CM

## 2023-12-16 LAB — CBC
HCT: 31.3 % — ABNORMAL LOW (ref 36.0–46.0)
Hemoglobin: 10 g/dL — ABNORMAL LOW (ref 12.0–15.0)
MCH: 25.3 pg — ABNORMAL LOW (ref 26.0–34.0)
MCHC: 31.9 g/dL (ref 30.0–36.0)
MCV: 79.2 fL — ABNORMAL LOW (ref 80.0–100.0)
Platelets: 268 10*3/uL (ref 150–400)
RBC: 3.95 MIL/uL (ref 3.87–5.11)
RDW: 13.7 % (ref 11.5–15.5)
WBC: 13.5 10*3/uL — ABNORMAL HIGH (ref 4.0–10.5)
nRBC: 0 % (ref 0.0–0.2)

## 2023-12-16 LAB — COMPREHENSIVE METABOLIC PANEL
ALT: 13 U/L (ref 0–44)
AST: 25 U/L (ref 15–41)
Albumin: 3.2 g/dL — ABNORMAL LOW (ref 3.5–5.0)
Alkaline Phosphatase: 59 U/L (ref 38–126)
Anion gap: 10 (ref 5–15)
BUN: 6 mg/dL (ref 6–20)
CO2: 20 mmol/L — ABNORMAL LOW (ref 22–32)
Calcium: 8.9 mg/dL (ref 8.9–10.3)
Chloride: 105 mmol/L (ref 98–111)
Creatinine, Ser: 0.59 mg/dL (ref 0.44–1.00)
GFR, Estimated: >60 mL/min (ref >60)
Glucose, Bld: 95 mg/dL (ref 70–99)
Potassium: 3.1 mmol/L — ABNORMAL LOW (ref 3.5–5.1)
Sodium: 135 mmol/L (ref 135–145)
Total Bilirubin: 0.4 mg/dL (ref 0.0–1.2)
Total Protein: 7 g/dL (ref 6.5–8.1)

## 2023-12-16 LAB — RAPID URINE DRUG SCREEN, HOSP PERFORMED
Amphetamines: NOT DETECTED
Barbiturates: NOT DETECTED
Benzodiazepines: NOT DETECTED
Cocaine: NOT DETECTED
Opiates: NOT DETECTED
Tetrahydrocannabinol: NOT DETECTED

## 2023-12-16 LAB — KLEIHAUER-BETKE STAIN
Fetal Cells %: 0 %
Quantitation Fetal Hemoglobin: 0 mL

## 2023-12-16 MED ORDER — LACTATED RINGERS IV BOLUS
1000.0000 mL | Freq: Once | INTRAVENOUS | Status: AC
Start: 2023-12-16 — End: 2023-12-16
  Administered 2023-12-16: 1000 mL via INTRAVENOUS

## 2023-12-16 MED ORDER — PRENATAL MULTIVITAMIN CH
1.0000 | ORAL_TABLET | Freq: Every day | ORAL | Status: DC
  Administered 2023-12-16: 1 via ORAL
  Filled 2023-12-16: qty 1

## 2023-12-16 MED ORDER — SODIUM CHLORIDE 0.9% FLUSH
3.0000 mL | INTRAVENOUS | Status: DC | PRN
Start: 2023-12-16 — End: 2023-12-17

## 2023-12-16 MED ORDER — CALCIUM CARBONATE ANTACID 500 MG PO CHEW: 2.0000 | CHEWABLE_TABLET | ORAL | Status: DC | PRN

## 2023-12-16 MED ORDER — DOXYLAMINE SUCCINATE (SLEEP) 25 MG PO TABS
25.0000 mg | ORAL_TABLET | Freq: Every evening | ORAL | Status: DC | PRN
  Administered 2023-12-16: 25 mg via ORAL
  Filled 2023-12-16: qty 1

## 2023-12-16 MED ORDER — LACTATED RINGERS IV SOLN: INTRAVENOUS | Status: AC

## 2023-12-16 MED ORDER — ACETAMINOPHEN 325 MG PO TABS: 650.0000 mg | ORAL_TABLET | ORAL | Status: DC | PRN

## 2023-12-16 MED ORDER — SODIUM CHLORIDE 0.9% FLUSH: 3.0000 mL | Freq: Two times a day (BID) | INTRAVENOUS | Status: DC

## 2023-12-16 MED ORDER — DOCUSATE SODIUM 100 MG PO CAPS
100.0000 mg | ORAL_CAPSULE | Freq: Every day | ORAL | Status: DC
  Filled 2023-12-16: qty 1

## 2023-12-16 MED ORDER — BETAMETHASONE SOD PHOS & ACET 6 (3-3) MG/ML IJ SUSP
12.0000 mg | INTRAMUSCULAR | Status: AC
  Administered 2023-12-16 – 2023-12-17 (×2): 12 mg via INTRAMUSCULAR
  Filled 2023-12-16: qty 5

## 2023-12-16 MED ORDER — ENOXAPARIN SODIUM 40 MG/0.4ML IJ SOSY
40.0000 mg | PREFILLED_SYRINGE | INTRAMUSCULAR | Status: DC
Start: 2023-12-16 — End: 2023-12-16

## 2023-12-16 MED ORDER — DIPHENHYDRAMINE HCL 25 MG PO CAPS
25.0000 mg | ORAL_CAPSULE | Freq: Once | ORAL | Status: AC
  Administered 2023-12-16: 25 mg via ORAL
  Filled 2023-12-16: qty 1

## 2023-12-16 MED ORDER — TERBUTALINE SULFATE 1 MG/ML IJ SOLN
0.2500 mg | Freq: Once | INTRAMUSCULAR | Status: AC
Start: 2023-12-16 — End: 2023-12-16

## 2023-12-16 MED ORDER — DIPHENHYDRAMINE HCL 50 MG/ML IJ SOLN: 12.5000 mg | Freq: Once | INTRAMUSCULAR | Status: AC

## 2023-12-16 MED ORDER — TERBUTALINE SULFATE 1 MG/ML IJ SOLN
INTRAMUSCULAR | Status: AC
  Administered 2023-12-16: 0.25 mg via SUBCUTANEOUS
  Filled 2023-12-16: qty 1

## 2023-12-16 MED ORDER — ACETAMINOPHEN 325 MG PO TABS
650.0000 mg | ORAL_TABLET | Freq: Once | ORAL | Status: AC
  Administered 2023-12-16: 650 mg via ORAL
  Filled 2023-12-16: qty 2

## 2023-12-16 MED ORDER — MELATONIN 3 MG PO TABS: 3.0000 mg | ORAL_TABLET | Freq: Every day | ORAL | Status: DC
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
## 2023-12-16 NOTE — Social Work (Signed)
CSW escorted [facility] CPS social worker Dorann Lodge [phone] to [facility] room 1S16 to complete an assessment.   No barriers to discharge.   Vivi Barrack, MSW, LCSW Clinical Social Worker  [phone] 12/16/2023  3:48 PM
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
## 2023-12-17 DIAGNOSIS — O9A213 Injury, poisoning and certain other consequences of external causes complicating pregnancy, third trimester: Secondary | ICD-10-CM | POA: Diagnosis not present

## 2023-12-17 DIAGNOSIS — Z3A30 30 weeks gestation of pregnancy: Secondary | ICD-10-CM | POA: Diagnosis not present
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
## 2024-04-15 ENCOUNTER — Encounter (HOSPITAL_BASED_OUTPATIENT_CLINIC_OR_DEPARTMENT_OTHER): Payer: Self-pay

## 2024-04-15 ENCOUNTER — Emergency Department (HOSPITAL_BASED_OUTPATIENT_CLINIC_OR_DEPARTMENT_OTHER)
Admission: EM | Admit: 2024-04-15 | Discharge: 2024-04-15 | Disposition: A | Discharge status: Home / Self Care | Payer: PRIVATE HEALTH INSURANCE | Attending: Emergency Medicine | Admitting: Emergency Medicine

## 2024-04-15 ENCOUNTER — Other Ambulatory Visit: Payer: Self-pay

## 2024-04-15 DIAGNOSIS — M7989 Other specified soft tissue disorders: Secondary | ICD-10-CM | POA: Diagnosis present

## 2024-04-15 DIAGNOSIS — L03012 Cellulitis of left finger: Secondary | ICD-10-CM | POA: Diagnosis not present

## 2024-04-15 MED ORDER — LIDOCAINE HCL (PF) 1 % IJ SOLN
5.0000 mL | Freq: Once | INTRAMUSCULAR | Status: AC
  Administered 2024-04-15: 5 mL
  Filled 2024-04-15: qty 5

## 2024-04-15 NOTE — ED Notes (Signed)
 ED Provider at bedside.

## 2024-04-15 NOTE — ED Triage Notes (Signed)
 Pt reports that she thinks that she has a splinter in the left thumb  for about about 2 days.

## 2024-04-15 NOTE — ED Provider Notes (Signed)
 [place] [facility] Provider Note   CSN: [id number] Arrival date & time: 04/15/24  1047     History  Chief Complaint  Patient presents with   thumb splinter    Debra Castro is a 23 y.o. female.  Pain and swelling at the distal left thumb for 2 days. She reports throbbing pain, the worst last night.   The history is provided by the patient. No language interpreter was used.       Home Medications Prior to Admission medications   Medication Sig Start Date End Date Taking? Authorizing Provider  methocarbamol  (ROBAXIN ) 500 MG tablet Take 1 tablet (500 mg total) by mouth 2 (two) times daily. 10/12/18   Maryanna Smart, PA-C  ondansetron  (ZOFRAN  ODT) 4 MG disintegrating tablet Take 1 tablet (4 mg total) by mouth every 8 (eight) hours as needed for nausea or vomiting. 11/17/20   Aberman, Caroline C, PA-C      Allergies    Patient has no known allergies.    Review of Systems   Review of Systems  Physical Exam Updated Vital Signs BP (!) 131/107 (BP Location: Right Arm)   Pulse 80   Temp 98 F (36.7 C)   Resp 18   Ht 5\' 11"  (1.803 m)   Wt 75.3 kg   LMP 05/18/2023   SpO2 100%   Breastfeeding Yes   BMI 23.15 kg/m  Physical Exam Vitals and nursing note reviewed.  Constitutional:      General: She is not in acute distress.    Appearance: She is well-developed. She is not ill-appearing.  Pulmonary:     Effort: Pulmonary effort is normal.  Musculoskeletal:        General: Normal range of motion.     Cervical back: Normal range of motion.     Comments: Left thumb has swelling along the base cuticle of the nail. No subungual findings. No redness or swelling of the proximal thumb.   Skin:    General: Skin is warm and dry.  Neurological:     Mental Status: She is alert and oriented to person, place, and time.     ED Results / Procedures / Treatments   Labs (all labs ordered are listed, but only abnormal results are  displayed) Labs Reviewed - No data to display  EKG None  Radiology No results found.  Procedures Drain paronychia  Date/Time: 04/15/2024 11:48 AM  Performed by: Mandy Second, PA-C Authorized by: Mandy Second, PA-C  Consent: Verbal consent obtained. Consent given by: patient Patient understanding: patient states understanding of the procedure being performed Patient identity confirmed: verbally with patient Local anesthesia used: yes Anesthesia: digital block  Anesthesia: Local anesthesia used: yes Local Anesthetic: lidocaine  1% without epinephrine Anesthetic total: 3 mL  Sedation: Patient sedated: no  Patient tolerance: patient tolerated the procedure well with no immediate complications       Medications Ordered in ED Medications  lidocaine  (PF) (XYLOCAINE ) 1 % injection 5 mL (5 mLs Infiltration Given by Other 04/15/24 1106)    ED Course/ Medical Decision Making/ A&P Clinical Course as of 04/15/24 1150  Fri Apr 15, 2024  1148 Patient with uncomplicated paronychia, I&D'd in ED.  [SU]    Clinical Course User Index [SU] Mandy Second, PA-C                                 Medical Decision Making Risk  Prescription drug management.           Final Clinical Impression(s) / ED Diagnoses Final diagnoses:  Paronychia of left thumb    Rx / DC Orders ED Discharge Orders     None         Mandy Second, PA-C 04/15/24 1150    Sallyanne Creamer, DO 04/19/24 2021

## 2024-04-15 NOTE — Discharge Instructions (Signed)
 Keep the thumb clean. Recommend twice a day soaks with warm Epson salt water. Alternatively, you can simply wash the thumb twice daily with antibacterial soap and water.   Tylenol  as needed for pain. Follow up with your doctor for recheck if symptoms start to recur.
